# Patient Record
Sex: Female | Born: 1975 | Race: White | Hispanic: No | Marital: Married
Health system: Southern US, Community
[De-identification: ages and names within clinical notes are randomized; demographics above are authoritative.]

## PROBLEM LIST (undated history)

## (undated) HISTORY — PX: KNEE ARTHROSCOPY: SUR90

## (undated) HISTORY — PX: ABDOMINAL HYSTERECTOMY: SHX81

---

## 2004-01-07 ENCOUNTER — Ambulatory Visit: Payer: Self-pay | Admitting: Anesthesiology

## 2004-06-17 ENCOUNTER — Inpatient Hospital Stay: Payer: Self-pay | Admitting: Obstetrics and Gynecology

## 2004-07-01 ENCOUNTER — Ambulatory Visit: Payer: Self-pay | Admitting: Obstetrics and Gynecology

## 2004-07-02 ENCOUNTER — Ambulatory Visit: Payer: Self-pay | Admitting: Obstetrics and Gynecology

## 2004-08-12 ENCOUNTER — Ambulatory Visit: Payer: Self-pay | Admitting: Unknown Physician Specialty

## 2004-08-17 ENCOUNTER — Inpatient Hospital Stay: Payer: Self-pay | Admitting: Unknown Physician Specialty

## 2004-11-04 ENCOUNTER — Ambulatory Visit: Payer: Self-pay | Admitting: Unknown Physician Specialty

## 2005-02-10 ENCOUNTER — Ambulatory Visit: Payer: Self-pay | Admitting: Pain Medicine

## 2005-02-11 ENCOUNTER — Ambulatory Visit: Payer: Self-pay | Admitting: Pain Medicine

## 2005-03-14 ENCOUNTER — Ambulatory Visit: Payer: Self-pay | Admitting: Pain Medicine

## 2005-03-22 ENCOUNTER — Ambulatory Visit: Payer: Self-pay | Admitting: Pain Medicine

## 2005-03-22 ENCOUNTER — Ambulatory Visit: Payer: Self-pay | Admitting: Unknown Physician Specialty

## 2005-04-06 ENCOUNTER — Ambulatory Visit: Payer: Self-pay | Admitting: Pain Medicine

## 2005-04-21 ENCOUNTER — Ambulatory Visit: Payer: Self-pay | Admitting: Unknown Physician Specialty

## 2005-05-04 ENCOUNTER — Ambulatory Visit: Payer: Self-pay | Admitting: Pain Medicine

## 2005-05-10 ENCOUNTER — Ambulatory Visit: Payer: Self-pay | Admitting: Pain Medicine

## 2005-05-20 ENCOUNTER — Emergency Department: Payer: Self-pay | Admitting: Unknown Physician Specialty

## 2005-06-07 ENCOUNTER — Emergency Department: Payer: Self-pay | Admitting: Emergency Medicine

## 2005-06-09 ENCOUNTER — Ambulatory Visit: Payer: Self-pay | Admitting: Unknown Physician Specialty

## 2005-11-02 ENCOUNTER — Ambulatory Visit: Payer: Self-pay | Admitting: Pain Medicine

## 2005-11-08 ENCOUNTER — Ambulatory Visit: Payer: Self-pay | Admitting: Pain Medicine

## 2005-11-10 ENCOUNTER — Ambulatory Visit: Payer: Self-pay | Admitting: Unknown Physician Specialty

## 2005-12-07 ENCOUNTER — Ambulatory Visit: Payer: Self-pay | Admitting: Pain Medicine

## 2005-12-22 ENCOUNTER — Ambulatory Visit: Payer: Self-pay | Admitting: Pain Medicine

## 2006-01-03 ENCOUNTER — Ambulatory Visit: Payer: Self-pay | Admitting: Pain Medicine

## 2006-01-25 ENCOUNTER — Ambulatory Visit: Payer: Self-pay | Admitting: Pain Medicine

## 2006-02-02 ENCOUNTER — Ambulatory Visit: Payer: Self-pay | Admitting: Pain Medicine

## 2006-06-01 ENCOUNTER — Ambulatory Visit: Payer: Self-pay | Admitting: Pain Medicine

## 2006-07-04 ENCOUNTER — Ambulatory Visit: Payer: Self-pay | Admitting: Pain Medicine

## 2006-07-19 ENCOUNTER — Ambulatory Visit: Payer: Self-pay | Admitting: Pain Medicine

## 2006-08-24 ENCOUNTER — Ambulatory Visit: Payer: Self-pay | Admitting: Pain Medicine

## 2006-10-02 ENCOUNTER — Ambulatory Visit: Payer: Self-pay | Admitting: Physician Assistant

## 2006-10-05 ENCOUNTER — Ambulatory Visit: Payer: Self-pay | Admitting: Pain Medicine

## 2006-11-01 ENCOUNTER — Ambulatory Visit: Payer: Self-pay | Admitting: Gastroenterology

## 2006-11-28 ENCOUNTER — Ambulatory Visit: Payer: Self-pay | Admitting: Unknown Physician Specialty

## 2006-11-30 ENCOUNTER — Ambulatory Visit: Payer: Self-pay | Admitting: Unknown Physician Specialty

## 2006-12-24 IMAGING — US US PELV - US TRANSVAGINAL
1 series · 14 of 25 positions shown · non-contrast
Comparison: none

REASON FOR EXAM: Status post C- Section 3 weeks ago, assess uterine cavity
for retained placenta
COMMENTS:

[Series 1: us pelv - us transvaginal · 0.37mm/px · 14 of 48 slices shown]
[im 1/48]
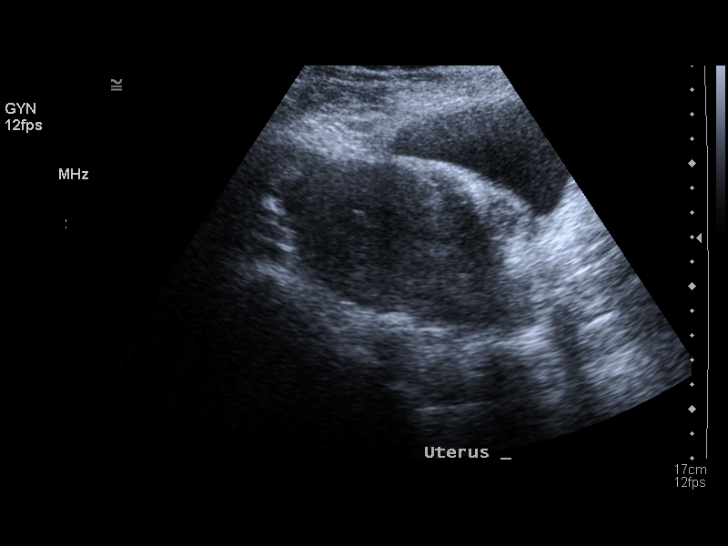
[im 4/48]
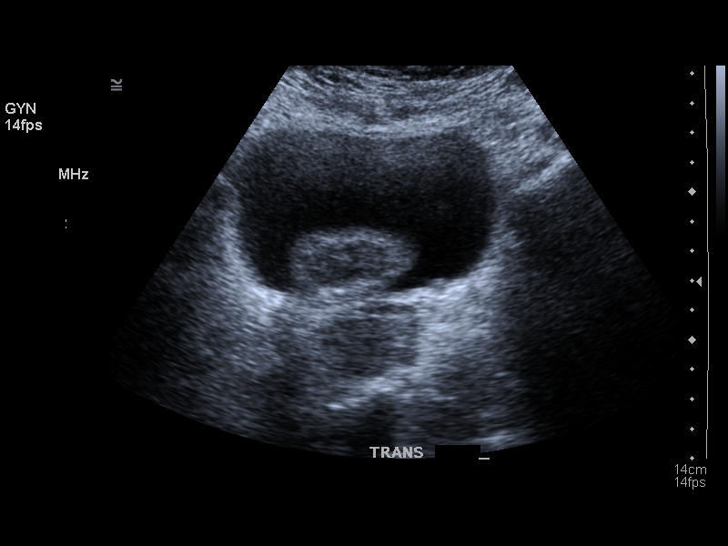
[im 8/48]
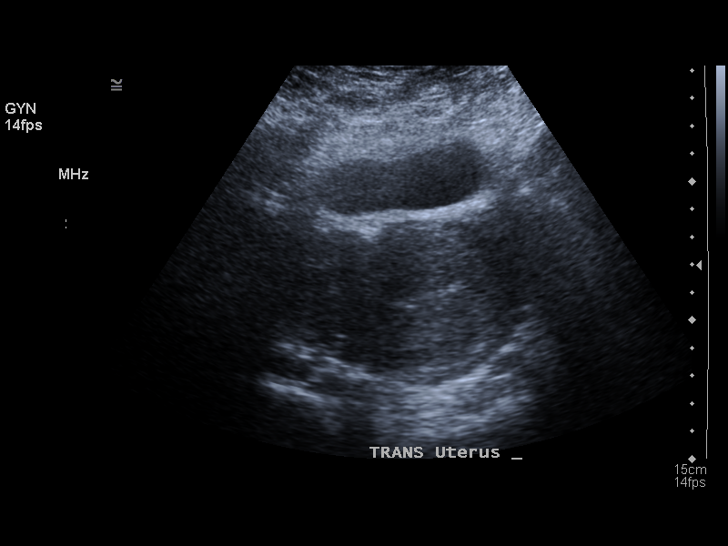
[im 12/48]
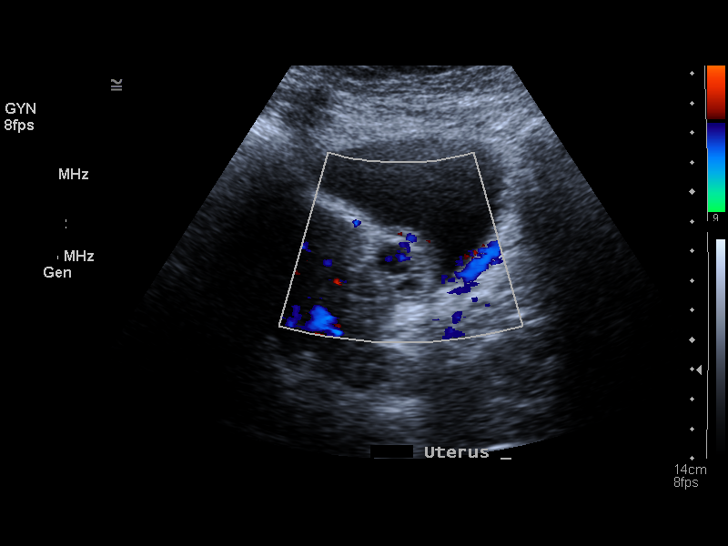
[im 16/48]
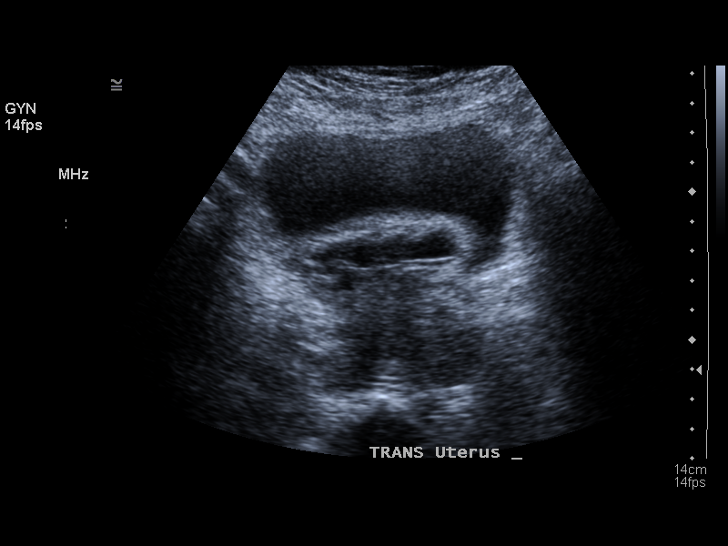
[im 18/48]
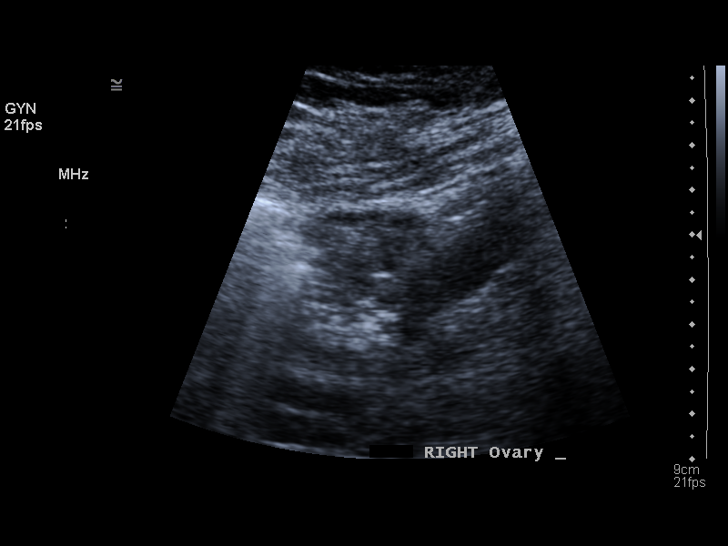
[im 22/48]
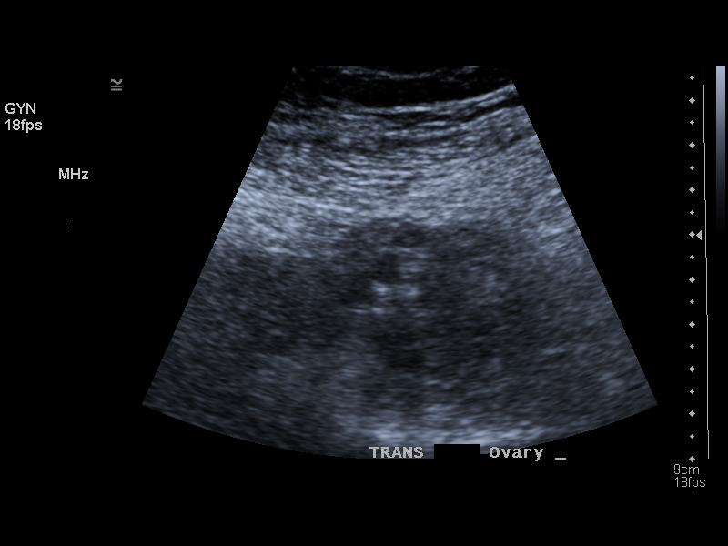
[im 26/48]
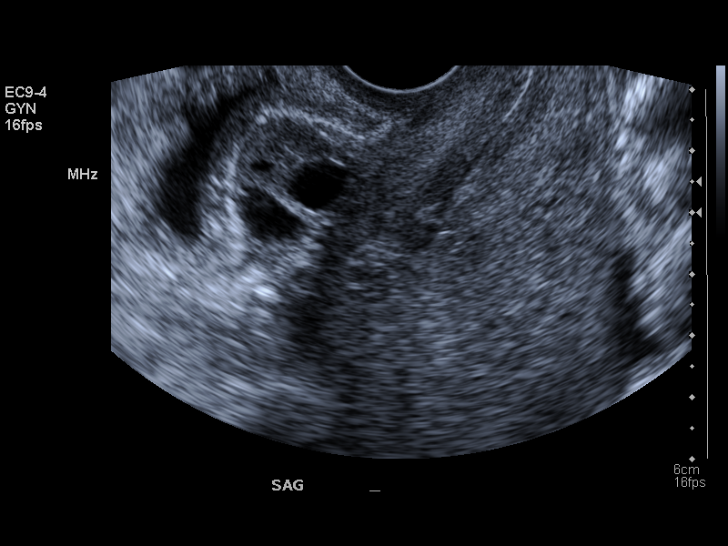
[im 30/48]
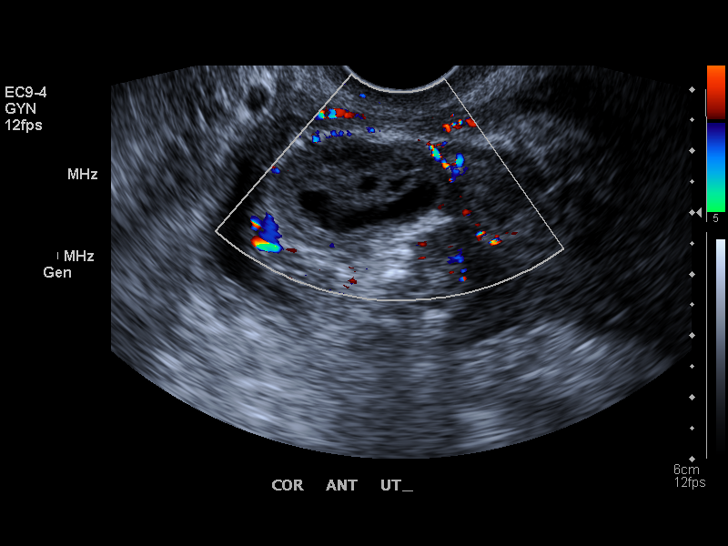
[im 32/48]
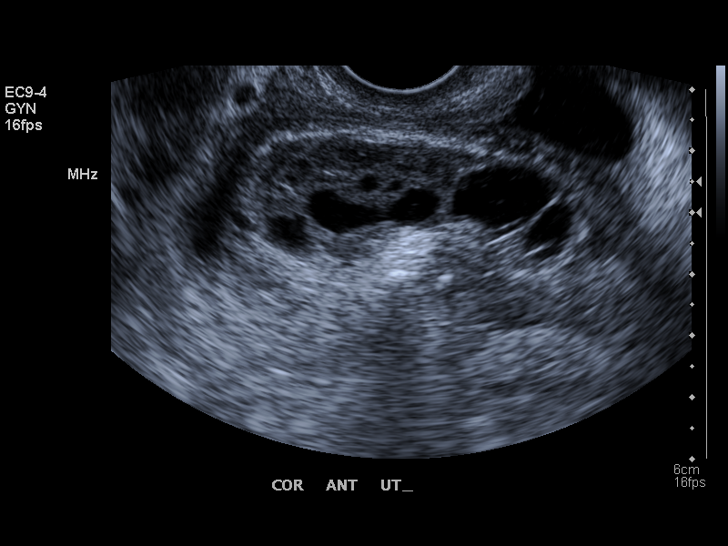
[im 36/48]
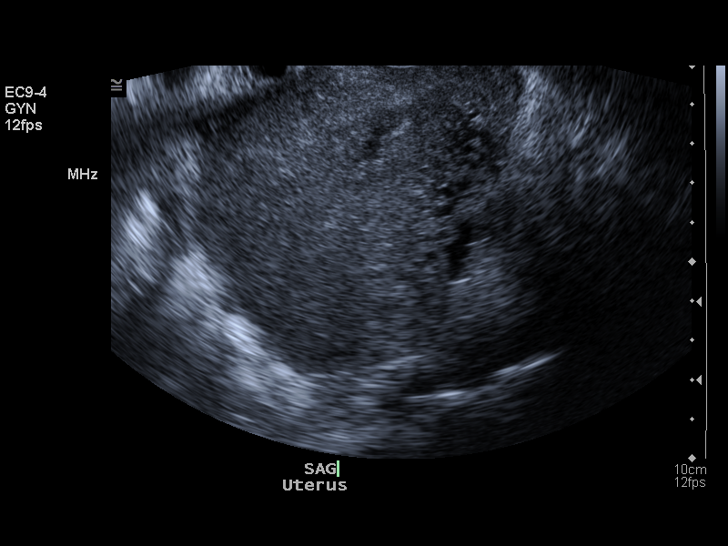
[im 40/48]
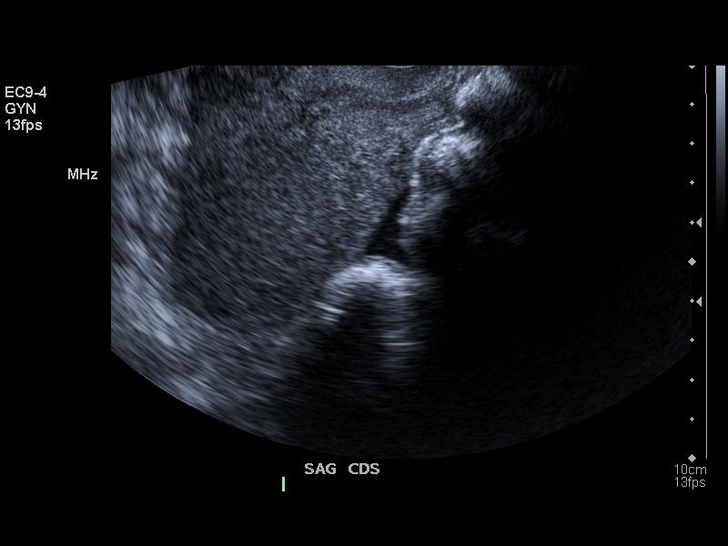
[im 44/48]
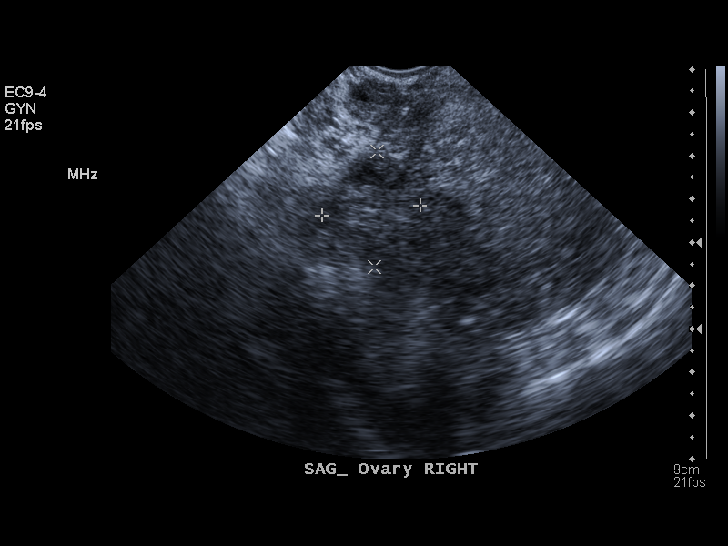
[im 48/48]
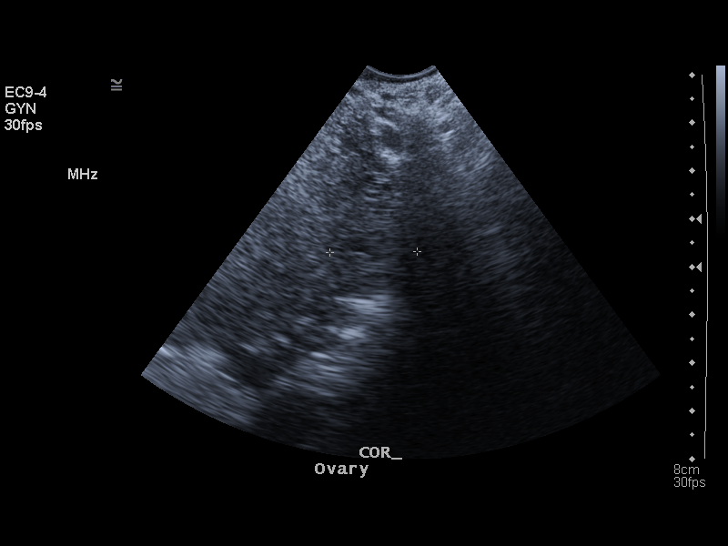

[14 of 25 positions shown; findings below may reference images not displayed]

PROCEDURE:     US  - US PELVIS MASS EXAM  - [DATE] [DATE] [DATE] [DATE]

RESULT:     Real-time imaging was obtained.

There is noted a 5.6 x 1.9 cm complex mass seen just anterior to the uterus
which may represent some residual hematoma from recent surgery. The patient
has had a C-section.

The uterus measures 10.0 x 6.3 x 7.7 cm. The endometrial stripe is faintly
seen and appears thickened which would be consistent with post partum. No
definite retained products are identified. A small amount of fluid is noted
in the cul-de-sac region.
IMPRESSION: Complex mass anterior to the uterus which may represent
resolving hematoma. The patient is three weeks status post C-section. No
definitive products of conception are seen. The endometrial stripe does
appear thickened which would be consistent with post partum.

## 2007-03-28 ENCOUNTER — Ambulatory Visit: Payer: Self-pay | Admitting: Pain Medicine

## 2007-04-10 ENCOUNTER — Ambulatory Visit: Payer: Self-pay | Admitting: Pain Medicine

## 2007-04-25 ENCOUNTER — Ambulatory Visit: Payer: Self-pay | Admitting: Physician Assistant

## 2007-05-08 ENCOUNTER — Ambulatory Visit: Payer: Self-pay | Admitting: Pain Medicine

## 2007-05-29 ENCOUNTER — Ambulatory Visit: Payer: Self-pay | Admitting: Pain Medicine

## 2007-06-18 ENCOUNTER — Ambulatory Visit: Payer: Self-pay | Admitting: Pain Medicine

## 2007-07-03 ENCOUNTER — Emergency Department: Payer: Self-pay | Admitting: Emergency Medicine

## 2007-07-10 ENCOUNTER — Emergency Department: Payer: Self-pay | Admitting: Internal Medicine

## 2007-08-03 ENCOUNTER — Emergency Department: Payer: Self-pay | Admitting: Emergency Medicine

## 2007-08-04 ENCOUNTER — Emergency Department: Payer: Self-pay | Admitting: Emergency Medicine

## 2007-11-14 ENCOUNTER — Ambulatory Visit: Payer: Self-pay | Admitting: Physician Assistant

## 2007-12-05 ENCOUNTER — Ambulatory Visit: Payer: Self-pay | Admitting: Pain Medicine

## 2007-12-20 ENCOUNTER — Ambulatory Visit: Payer: Self-pay | Admitting: Pain Medicine

## 2008-06-23 ENCOUNTER — Inpatient Hospital Stay: Payer: Self-pay | Admitting: Unknown Physician Specialty

## 2008-11-12 ENCOUNTER — Ambulatory Visit: Payer: Self-pay | Admitting: Physician Assistant

## 2008-11-17 ENCOUNTER — Emergency Department: Payer: Self-pay | Admitting: Emergency Medicine

## 2008-12-25 ENCOUNTER — Ambulatory Visit: Payer: Self-pay | Admitting: Physician Assistant

## 2012-01-09 ENCOUNTER — Emergency Department: Payer: Self-pay | Admitting: Emergency Medicine

## 2012-01-09 LAB — COMPREHENSIVE METABOLIC PANEL
Albumin: 3.8 g/dL (ref 3.4–5.0)
Alkaline Phosphatase: 84 U/L (ref 50–136)
BUN: 11 mg/dL (ref 7–18)
Calcium, Total: 9.1 mg/dL (ref 8.5–10.1)
Co2: 29 mmol/L (ref 21–32)
EGFR (African American): >60
EGFR (Non-African Amer.): >60
Glucose: 101 mg/dL — ABNORMAL HIGH (ref 65–99)
Osmolality: 270 (ref 275–301)
SGOT(AST): 24 U/L (ref 15–37)
SGPT (ALT): 41 U/L (ref 12–78)
Sodium: 135 mmol/L — ABNORMAL LOW (ref 136–145)

## 2012-01-09 LAB — CBC
HCT: 42.6 % (ref 35.0–47.0)
MCHC: 34.7 g/dL (ref 32.0–36.0)
MCV: 88 fL (ref 80–100)
Platelet: 417 10*3/uL (ref 150–440)
RBC: 4.84 10*6/uL (ref 3.80–5.20)
RDW: 13.1 % (ref 11.5–14.5)
WBC: 7.9 10*3/uL (ref 3.6–11.0)

## 2012-01-09 LAB — URINALYSIS, COMPLETE
Bilirubin,UR: NEGATIVE
Blood: NEGATIVE
Leukocyte Esterase: NEGATIVE
Nitrite: NEGATIVE
Ph: 6 (ref 4.5–8.0)
Protein: NEGATIVE
Specific Gravity: 1.012 (ref 1.003–1.030)
Squamous Epithelial: 2

## 2012-01-09 LAB — WET PREP, GENITAL

## 2012-01-09 LAB — LIPASE, BLOOD: Lipase: 144 U/L (ref 73–393)

## 2014-07-16 IMAGING — US US PELV - US TRANSVAGINAL
1 series · 14 of 25 positions shown · non-contrast
Comparison: none

REASON FOR EXAM: LLQ pain eval for ovarian cyst torsion
COMMENTS:

[Series 1: us pelv - us transvaginal · 0.28mm/px · 14 of 45 slices shown]
[im 1/45]
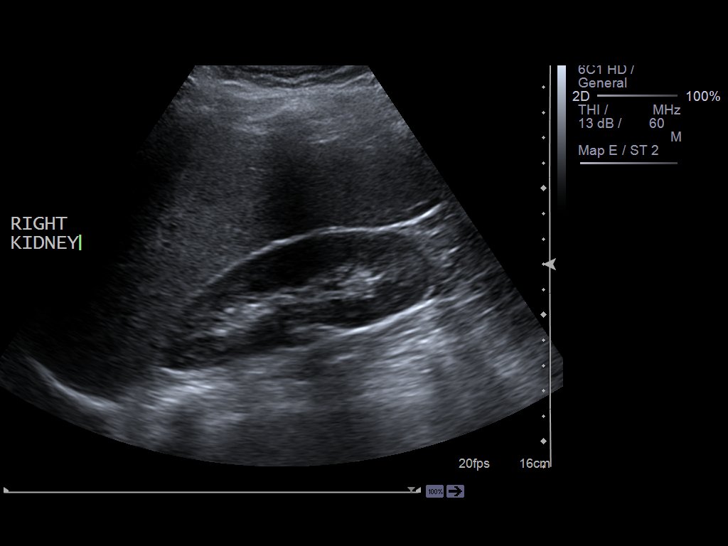
[im 4/45]
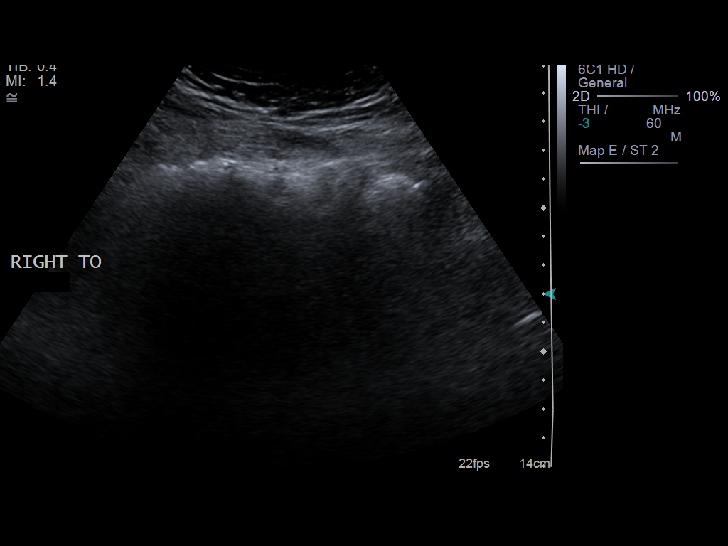
[im 8/45]
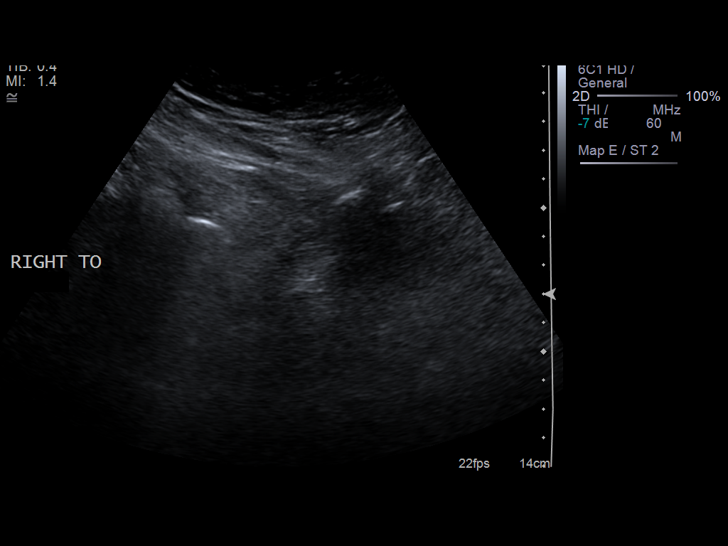
[im 12/45]
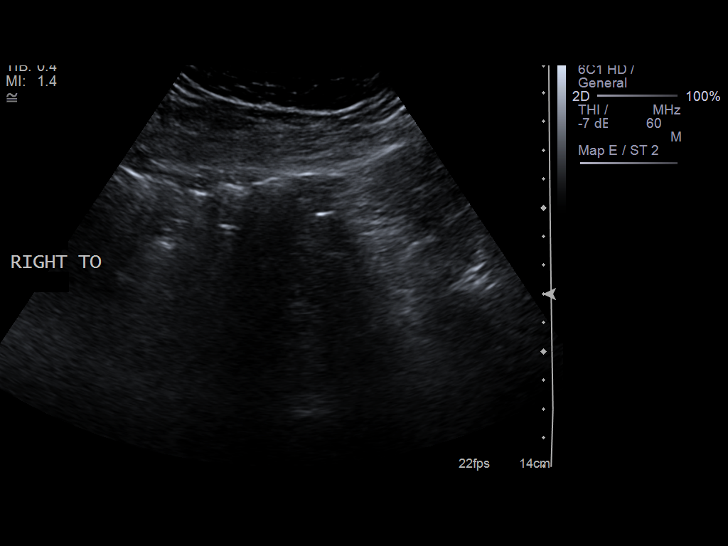
[im 15/45]
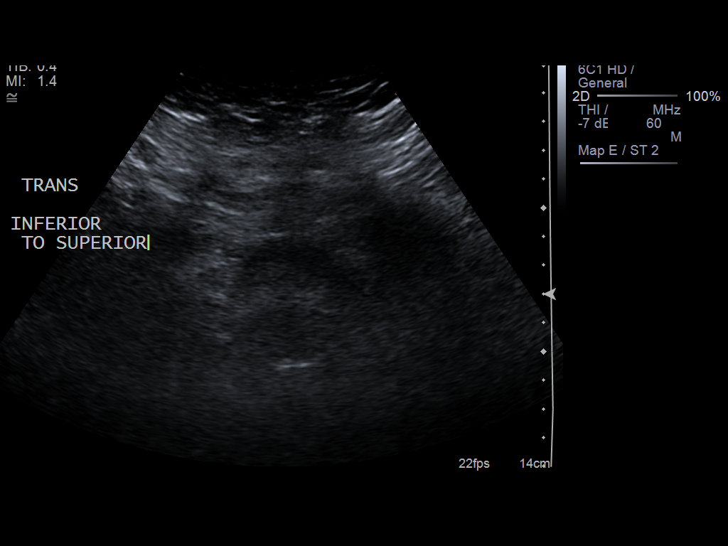
[im 17/45]
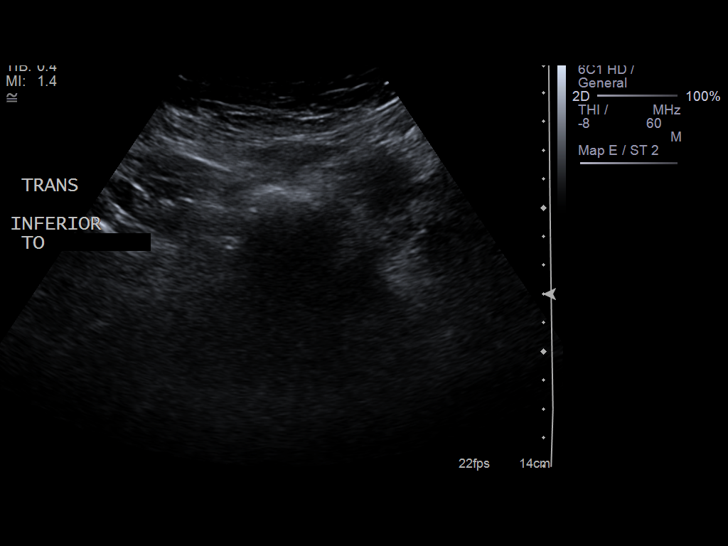
[im 21/45]
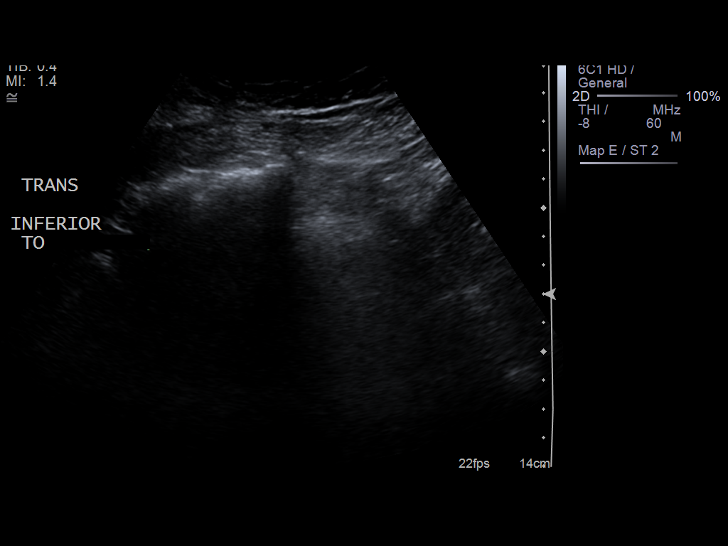
[im 24/45]
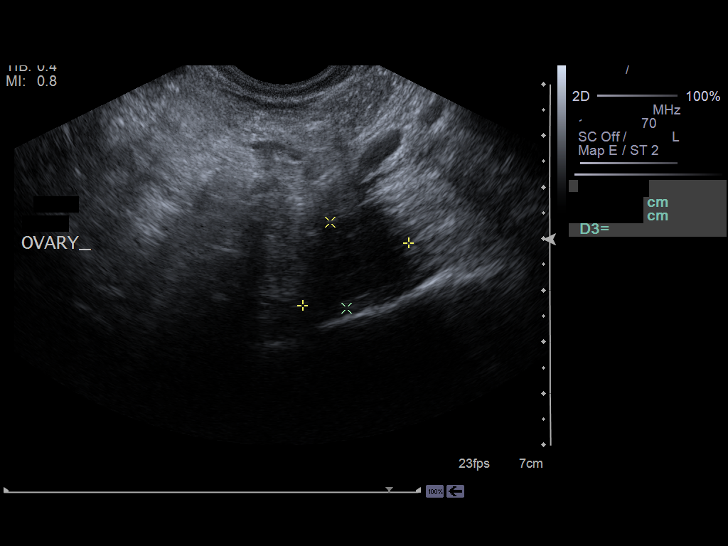
[im 28/45]
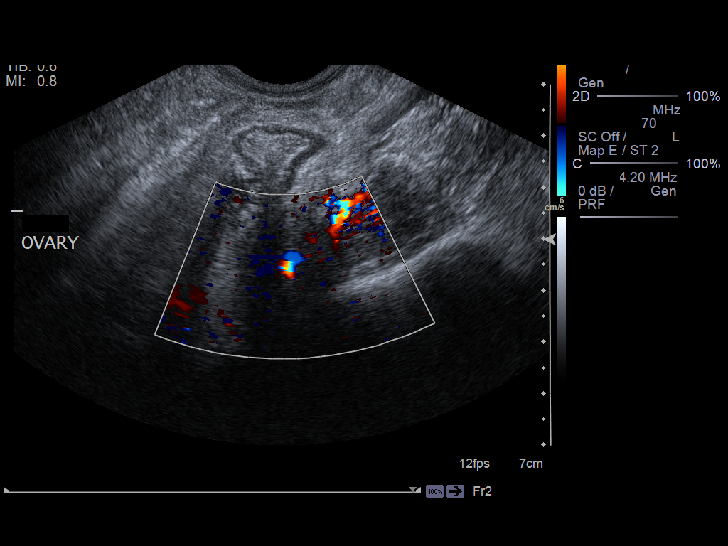
[im 30/45]
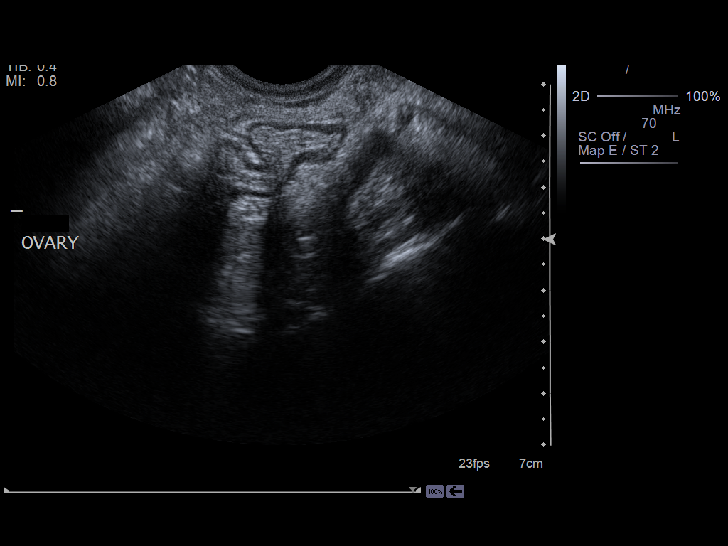
[im 34/45]
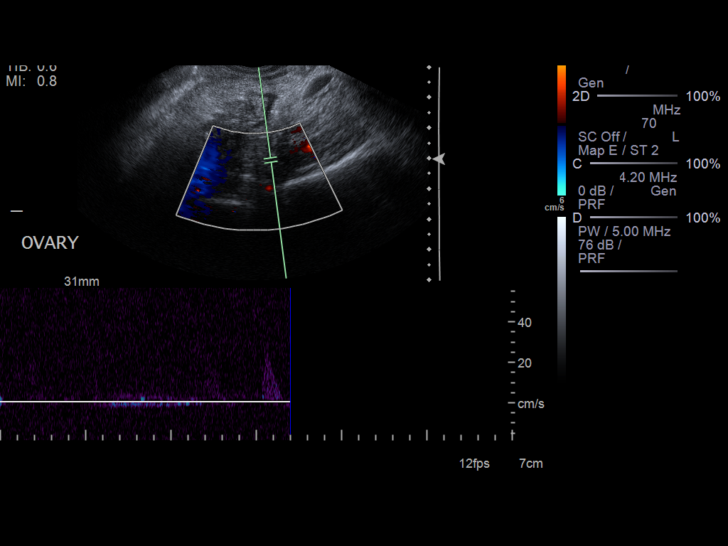
[im 37/45]
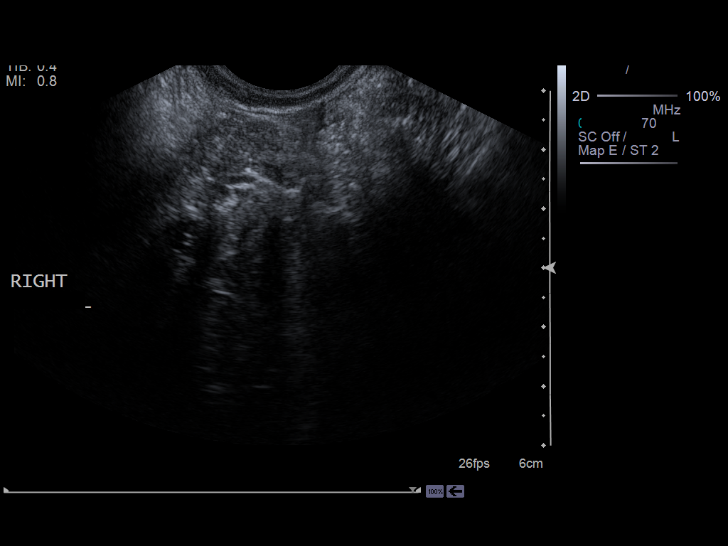
[im 41/45]
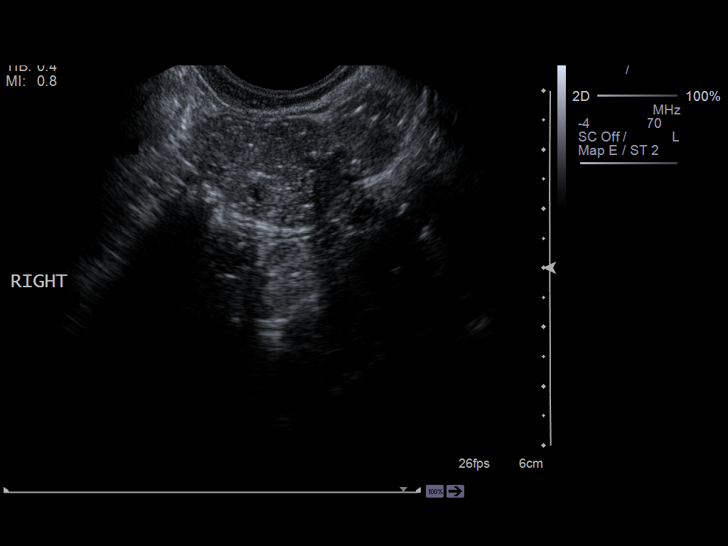
[im 45/45]
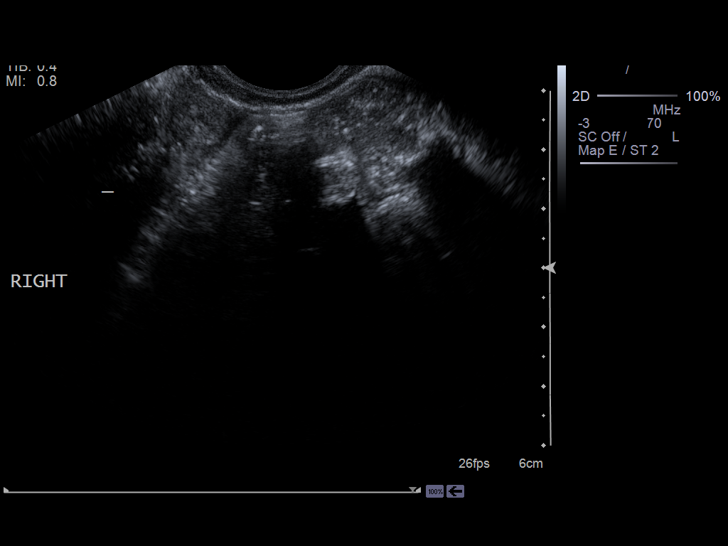

[14 of 25 positions shown; findings below may reference images not displayed]

PROCEDURE:     US  - US PELVIS EXAM W/TRANSVAGINAL  - January 10, 2012 [DATE]

RESULT:     Transabdominal and endovaginal pelvic sonogram is performed.
There is a history of hysterectomy. The kidneys appear grossly normal on
survey images. The right ovary is not seen. The left ovary measures 2.38 x
1.70 x 2.01 cm with blood flow documented utilizing color and SPECTRAL
Doppler. There is no abnormal fluid collection or free fluid.
IMPRESSION: 1.     Normal-appearing left ovary. No other focal abnormality. Please see
above.

[REDACTED]

## 2014-07-17 IMAGING — CT CT ABD-PELV W/ CM
1 of 4 series · 14 of 32 positions shown, 19 images · non-contrast
Comparison: none

REASON FOR EXAM: (1) LLQ abd pain eval; (2) LLQ abd pain eval
COMMENTS:

[Series 2: 3mm soft tissue · axial · 0.68mm/px · z∈[-520,-70]mm · 14 of 170 slices shown, 19 images]
[im 10/170  soft-tissue]
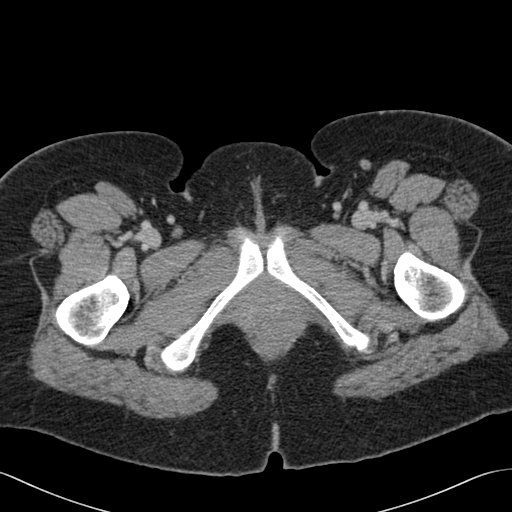
[im 10/170  bone]
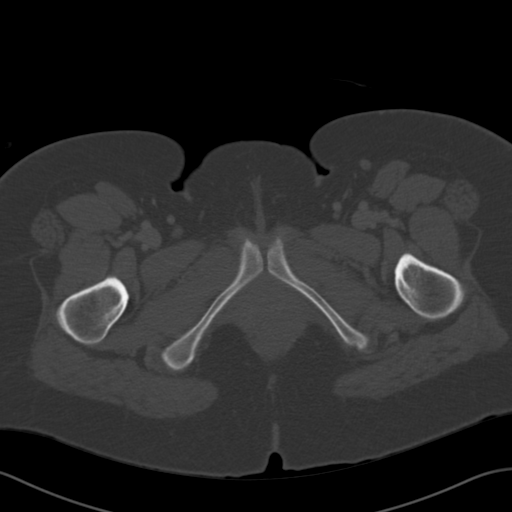
[im 20/170  soft-tissue]
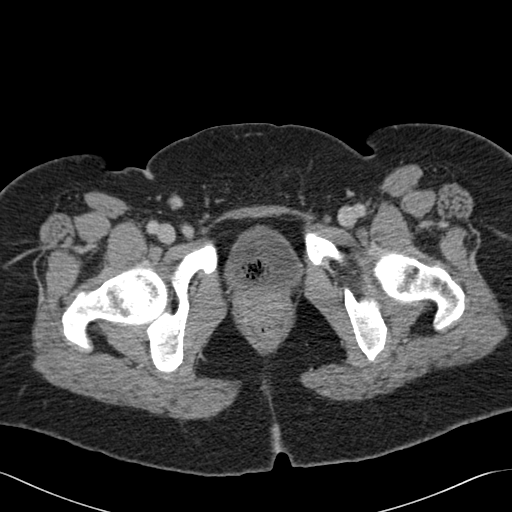
[im 40/170  soft-tissue]
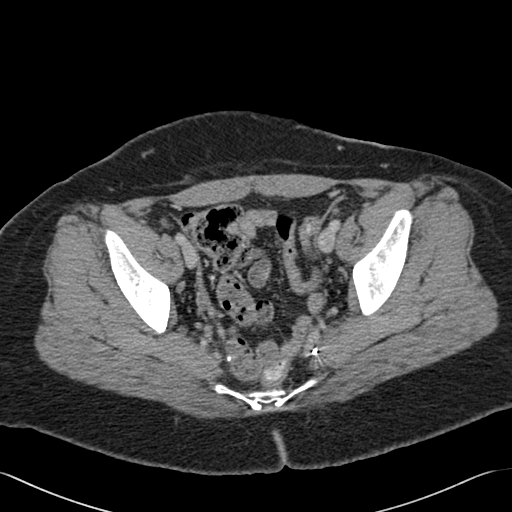
[im 50/170  soft-tissue]
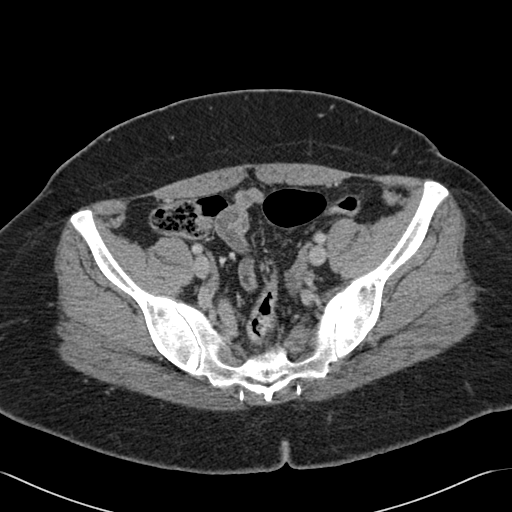
[im 60/170  soft-tissue]
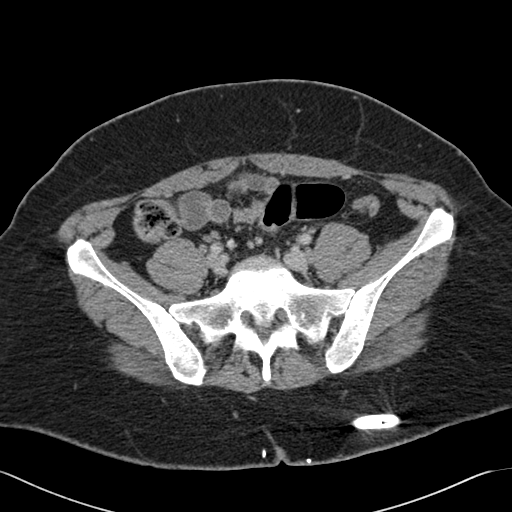
[im 70/170  soft-tissue]
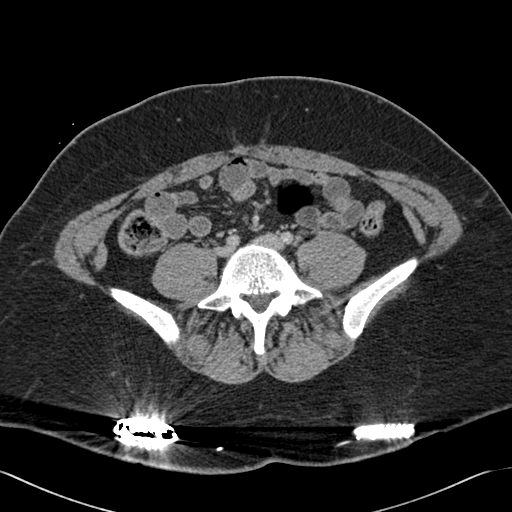
[im 90/170  soft-tissue]
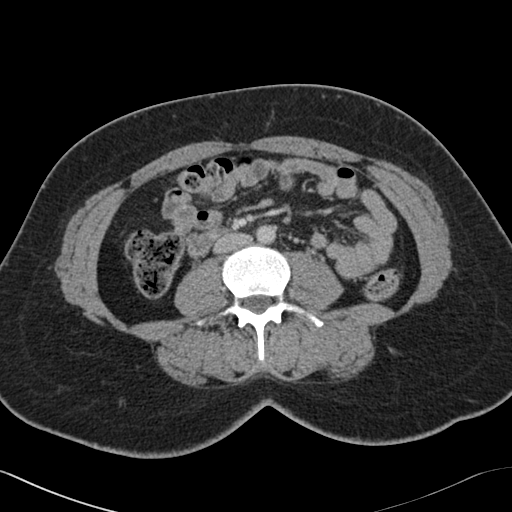
[im 100/170  soft-tissue]
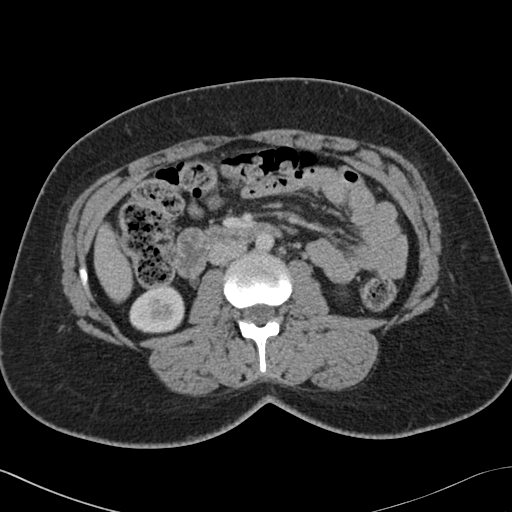
[im 110/170  soft-tissue]
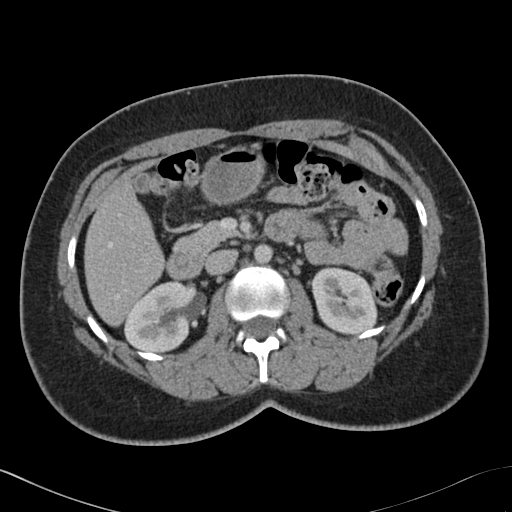
[im 110/170  bone]
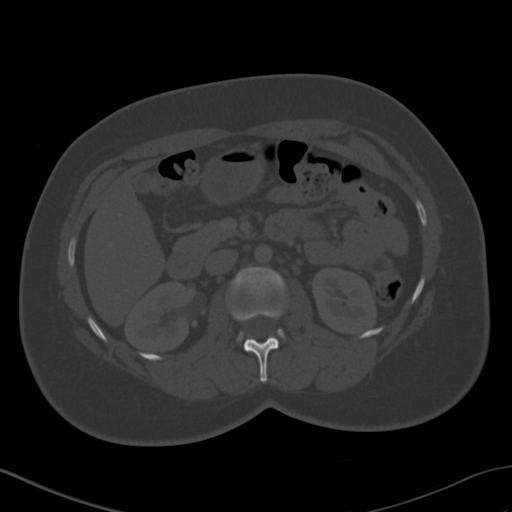
[im 120/170  soft-tissue]
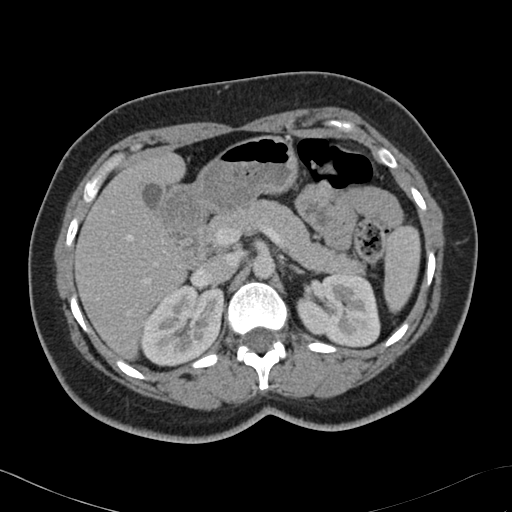
[im 130/170  soft-tissue]
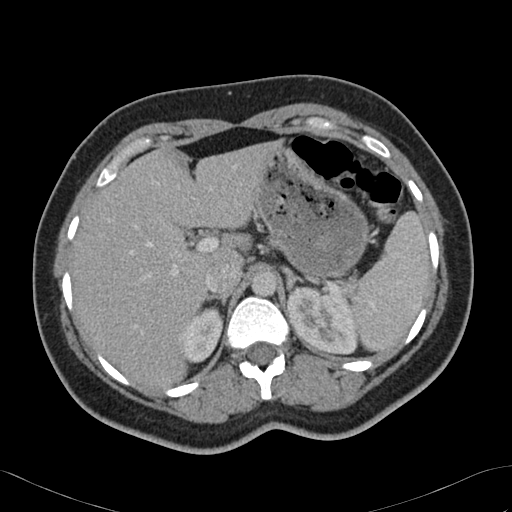
[im 130/170  lung]
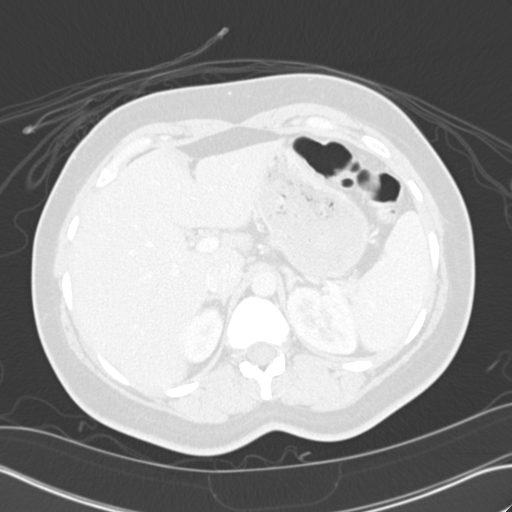
[im 140/170  lung]
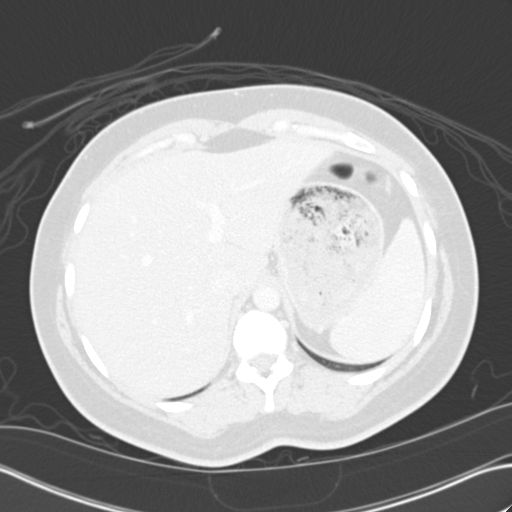
[im 150/170  soft-tissue]
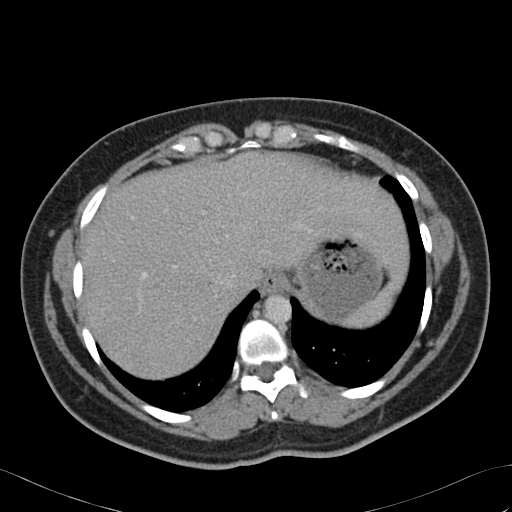
[im 150/170  lung]
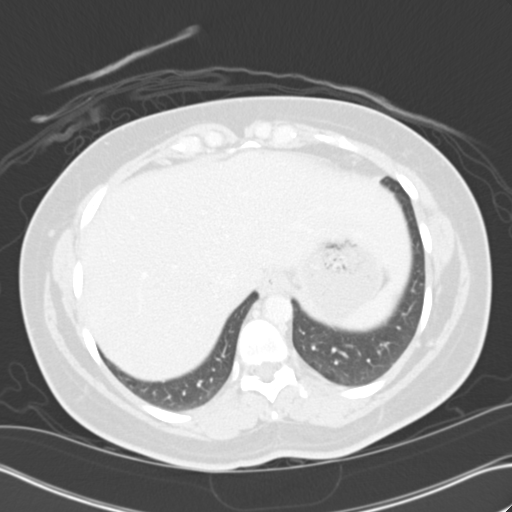
[im 160/170  soft-tissue]
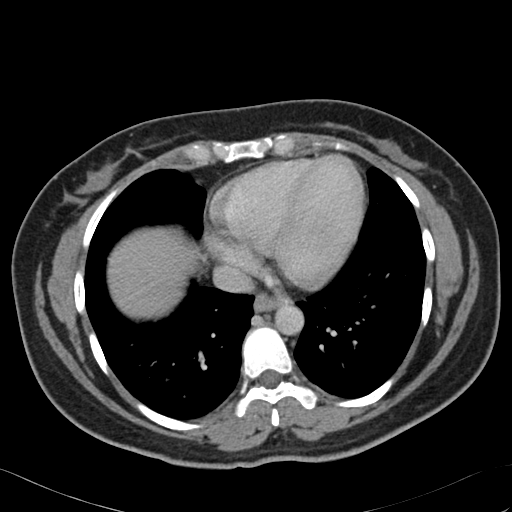
[im 160/170  lung]
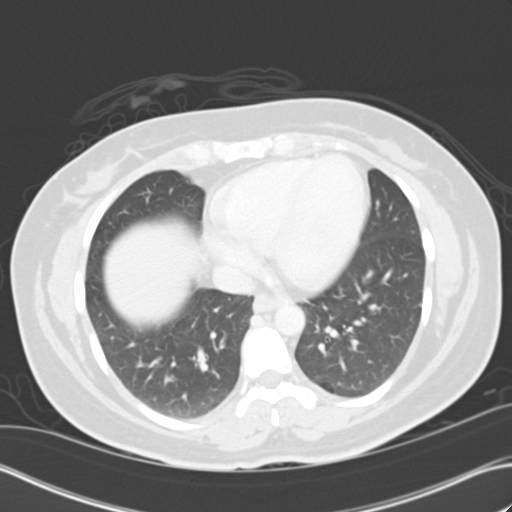

[14 of 32 positions shown; findings below may reference images not displayed]

PROCEDURE:     CT  - CT ABDOMEN / PELVIS  W  - January 10, 2012  [DATE]

RESULT:     CT of the abdomen and pelvis is performed with 100 mL of
Psovue-QZZ iodinated intravenous contrast without oral contrast being
utilized. Images are reconstructed at 3 mm slice thickness in the axial
plane. There is no previous exam for comparison.

Stimulators are present in the subcutaneous tissues over the upper pelvic
region bilaterally with the electrode tips in the presacral region. There is
a small amount of urine in the bladder. There is no ascites or abnormal
fluid collection. There is no evidence of abnormal bowel distention or bowel
wall thickening. The abdominal viscera appear within normal limits. No
gallstones are evident. The kidneys show no obstruction. Both kidneys
excrete contrast opacified urine on post contrast delayed images. There is
no evidence of an acute bony abnormality. The lung bases show dependent
atelectasis. There are some scattered nonenlarged right lower quadrant
mesenteric lymph nodes. Correlate for mesenteric adenitis. There may be a
small amount of fluid in the vagina. No contrast opacified urine is seen in
the vagina on the post contrast delayed images. Small anterior lower
mediastinal lymph nodes are seen near the diaphragm to the right. There are
some mildly prominent inguinal lymph nodes bilaterally. Correlate
clinically. Bony structures appear unremarkable. There is no evidence of
acute appendicitis or other acute inflammatory process.
IMPRESSION: 1. Basilar atelectasis in both lungs.
2. There are some nonenlarged right lower quadrant lymph nodes which could
represent mesenteric adenitis. Correlate clinically.
3. The patient is status post hysterectomy. Given the patient's complaints,
follow-up pelvic ultrasound may be beneficial.

[REDACTED]

## 2022-03-29 ENCOUNTER — Emergency Department
Admission: EM | Admit: 2022-03-29 | Discharge: 2022-03-29 | Disposition: A | Discharge status: Home / Self Care | Payer: Self-pay | Attending: Emergency Medicine | Admitting: Emergency Medicine

## 2022-03-29 ENCOUNTER — Encounter: Payer: Self-pay | Admitting: Emergency Medicine

## 2022-03-29 DIAGNOSIS — G4700 Insomnia, unspecified: Secondary | ICD-10-CM | POA: Insufficient documentation

## 2022-03-29 LAB — URINE DRUG SCREEN, QUALITATIVE (ARMC ONLY)
Amphetamines, Ur Screen: NOT DETECTED
Barbiturates, Ur Screen: NOT DETECTED
Benzodiazepine, Ur Scrn: NOT DETECTED
Cannabinoid 50 Ng, Ur ~~LOC~~: POSITIVE — AB
Cocaine Metabolite,Ur ~~LOC~~: NOT DETECTED
MDMA (Ecstasy)Ur Screen: NOT DETECTED
Methadone Scn, Ur: NOT DETECTED
Opiate, Ur Screen: NOT DETECTED
Phencyclidine (PCP) Ur S: NOT DETECTED
Tricyclic, Ur Screen: NOT DETECTED

## 2022-03-29 LAB — URINALYSIS, ROUTINE W REFLEX MICROSCOPIC
Bacteria, UA: NONE SEEN
Bilirubin Urine: NEGATIVE
Glucose, UA: NEGATIVE mg/dL
Hgb urine dipstick: NEGATIVE
Ketones, ur: 20 mg/dL — AB
Nitrite: NEGATIVE
Protein, ur: NEGATIVE mg/dL
Specific Gravity, Urine: 1.029 (ref 1.005–1.030)
pH: 5 (ref 5.0–8.0)

## 2022-03-29 MED ORDER — TRAZODONE HCL 100 MG PO TABS
100.0000 mg | ORAL_TABLET | Freq: Once | ORAL | Status: AC
  Administered 2022-03-29: 100 mg via ORAL
  Filled 2022-03-29: qty 1

## 2022-03-29 MED ORDER — TRAZODONE HCL 100 MG PO TABS: 100.0000 mg | ORAL_TABLET | Freq: Every evening | ORAL | 0 refills | Status: AC | PRN

## 2022-03-29 NOTE — Discharge Instructions (Addendum)
Please go to the following website to schedule new (and existing) patient appointments:   [email or web address]   The following is a list of primary care offices in the area who are accepting new patients at this time.  Please reach out to one of them directly and let them know you would like to schedule an appointment to follow up on an Emergency Department visit, and/or to establish a new primary care provider (PCP).  There are likely other primary care clinics in the are who are accepting new patients, but this is an excellent place to start:  [facility] physician: Dr Lavon Paganini [address] [place], [place] [address] [phone]  [facility] Lead Physician: Dr Steele Sizer [address], [place], [place] [address] [phone]  [facility] Physician: Dr Park Liter [address] [place], [place] [address] [phone]  [facility] Lead Physician: Dr Dewaine Oats [address] [place], [place] [address] [phone]  [facility] Physician: Dr Halina Maidens [address] [place], [place],  [address] [phone]   You may also try over-the-counter medications such as Unisom which contains doxylamine or medications that have Benadryl to help with sleep.

## 2022-03-29 NOTE — ED Triage Notes (Signed)
Pt presents via POV with complaints of insomnia. Pt states she hasn't slept since Saturday night and she was only to sleep a few hours. She notes taking melatonin tonight (10mg ) without any improvement in her sx. Denies stressors, CP or SOB.

## 2022-03-29 NOTE — ED Notes (Signed)
Pt said that she has not been able to sleep well since being taken off of trazodone.

## 2022-03-29 NOTE — ED Provider Notes (Signed)
[facility] Provider Note    Event Date/Time   First MD Initiated Contact with Patient 03/29/22 [phone]     (approximate)   History   Insomnia   HPI  Heather Dalton is a 47 y.o. female with previous history of insomnia about 10 years ago who presents to the emergency department with inability to sleep more than a few hours at a time for the past 3 days.  States "I feel like I am going crazy".  No SI or HI.  Denies any pain.  Has tried melatonin once without relief.  Has not tried any other over-the-counter medications.  States she has been on trazodone previously which did help her with sleep.  He denies any increase in stimulant intake.  Does state that she smoked marijuana recently to try to help her sleep.    History provided by patient and significant other.    History reviewed. No pertinent past medical history.  Past Surgical History:  Procedure Laterality Date   ABDOMINAL HYSTERECTOMY     KNEE ARTHROSCOPY Left     MEDICATIONS:  Prior to Admission medications   Not on File    Physical Exam   Triage Vital Signs: ED Triage Vitals  Enc Vitals Group     BP 03/29/22 0052 (!) 141/88     Pulse Rate 03/29/22 0052 90     Resp 03/29/22 0052 18     Temp 03/29/22 0052 97.9 F (36.6 C)     Temp Source 03/29/22 0052 Oral     SpO2 03/29/22 0052 98 %     Weight 03/29/22 0052 230 lb (104.3 kg)     Height 03/29/22 0052 5\' 7"  (1.702 m)     Head Circumference --      Peak Flow --      Pain Score 03/29/22 0051 0     Pain Loc --      Pain Edu? --      Excl. in [place]? --     Most recent vital signs: Vitals:   03/29/22 0052 03/29/22 0053  BP: (!) 141/88 (!) 141/88  Pulse: 90 80  Resp: 18 18  Temp: 97.9 F (36.6 C) 97.9 F (36.6 C)  SpO2: 98% 98%    CONSTITUTIONAL: Alert, responds appropriately to questions. Well-appearing; well-nourished HEAD: Normocephalic, atraumatic EYES: Conjunctivae clear, pupils appear equal, sclera nonicteric ENT:  normal nose; moist mucous membranes NECK: Supple, normal ROM CARD: RRR; S1 and S2 appreciated RESP: Normal chest excursion without splinting or tachypnea; breath sounds clear and equal bilaterally; no wheezes, no rhonchi, no rales, no hypoxia or respiratory distress, speaking full sentences ABD/GI: Non-distended; soft, non-tender, no rebound, no guarding, no peritoneal signs BACK: The back appears normal EXT: Normal ROM in all joints; no deformity noted, no edema SKIN: Normal color for age and race; warm; no rash on exposed skin NEURO: Moves all extremities equally, normal speech PSYCH: The patient's mood and manner are appropriate.  No SI or HI.   ED Results / Procedures / Treatments   LABS: (all labs ordered are listed, but only abnormal results are displayed) Labs Reviewed  URINALYSIS, ROUTINE W REFLEX MICROSCOPIC - Abnormal; Notable for the following components:      Result Value   Color, Urine YELLOW (*)    APPearance HAZY (*)    Ketones, ur 20 (*)    Leukocytes,Ua TRACE (*)    All other components within normal limits  URINE DRUG SCREEN, QUALITATIVE ([facility] ONLY) - Abnormal;  Notable for the following components:   Cannabinoid 50 Ng, Ur [place] POSITIVE (*)    All other components within normal limits     EKG:  RADIOLOGY: My personal review and interpretation of imaging:    I have personally reviewed all radiology reports.   No results found.   PROCEDURES:  Critical Care performed: No     Procedures    IMPRESSION / MDM / ASSESSMENT AND PLAN / ED COURSE  I reviewed the triage vital signs and the nursing notes.    Patient here with insomnia.  Denies any increased stressors, SI or HI.     DIFFERENTIAL DIAGNOSIS (includes but not limited to):   Insomnia, poor sleep hygiene, less likely anxiety or depression   Patient's presentation is most consistent with acute, uncomplicated illness.   PLAN: Patient here with insomnia.  Discussed sleep hygiene and will  provide written instructions as well.  She states trazodone has helped her in 2013, 2014 when she had insomnia.  Will give dose of trazodone here and discharged with prescription of the same along with PCP outpatient follow-up.  Discussed with patient she can also continue melatonin over-the-counter and try other medications over-the-counter such as doxylamine and diphenhydramine as needed.   MEDICATIONS GIVEN IN ED: Medications  traZODone (DESYREL) tablet 100 mg (100 mg Oral Given 03/29/22 0250)     ED COURSE:  At this time, I do not feel there is any life-threatening condition present. I reviewed all nursing notes, vitals, pertinent previous records.  All lab and urine results, EKGs, imaging ordered have been independently reviewed and interpreted by myself.  I reviewed all available radiology reports from any imaging ordered this visit.  Based on my assessment, I feel the patient is safe to be discharged home without further emergent workup and can continue workup as an outpatient as needed. Discussed all findings, treatment plan as well as usual and customary return precautions.  They verbalize understanding and are comfortable with this plan.  Outpatient follow-up has been provided as needed.  All questions have been answered.    CONSULTS:  none   OUTSIDE RECORDS REVIEWED: No previous records for review.       FINAL CLINICAL IMPRESSION(S) / ED DIAGNOSES   Final diagnoses:  Insomnia, unspecified type     Rx / DC Orders   ED Discharge Orders          Ordered    traZODone (DESYREL) 100 MG tablet  At bedtime PRN        03/29/22 0245             Note:  This document was prepared using Dragon voice recognition software and may include unintentional dictation errors.   Catherine Oak, Delice Bison, DO 03/29/22 [phone]
